# Patient Record
Sex: Female | Born: 2005 | Race: Black or African American | Hispanic: No | Marital: Single | Smoking: Never smoker
Health system: Southern US, Community
[De-identification: ages and names within clinical notes are randomized; demographics above are authoritative.]

## PROBLEM LIST (undated history)

## (undated) DIAGNOSIS — J45909 Unspecified asthma, uncomplicated: Secondary | ICD-10-CM

## (undated) DIAGNOSIS — R519 Headache, unspecified: Secondary | ICD-10-CM

## (undated) HISTORY — DX: Headache, unspecified: R51.9

## (undated) HISTORY — PX: TONSILLECTOMY: SUR1361

---
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
## 2023-01-21 ENCOUNTER — Telehealth: Payer: BC Managed Care – PPO | Admitting: Nurse Practitioner

## 2023-01-21 DIAGNOSIS — R110 Nausea: Secondary | ICD-10-CM | POA: Diagnosis not present

## 2023-01-21 DIAGNOSIS — G4489 Other headache syndrome: Secondary | ICD-10-CM

## 2023-01-21 MED ORDER — ONDANSETRON 4 MG PO TBDP: 4.0000 mg | ORAL_TABLET | Freq: Three times a day (TID) | ORAL | 0 refills | Status: AC | PRN

## 2023-01-21 NOTE — Progress Notes (Signed)
E-Visit for Nausea   We are sorry that you are not feeling well. Here is how we plan to help!  Migraines and headaches are not something that we are currently treating over E-visits. We would encourage you to talk to your primary care provider about headache management.  In order to help with your nausea and assure you are hydrated we have prescribed something for the nausea below.   I have prescribed a medication that will help alleviate your symptoms and allow you to stay hydrated:  Zofran 4 mg 1 tablet every 8 hours as needed for nausea and vomiting  HOME CARE: Drink clear liquids.  This is very important! Dehydration (the lack of fluid) can lead to a serious complication.  Start off with 1 tablespoon every 5 minutes for 8 hours. You may begin eating bland foods after 8 hours without vomiting.  Start with saltine crackers, white bread, rice, mashed potatoes, applesauce. After 48 hours on a bland diet, you may resume a normal diet. Try to go to sleep.  Sleep often empties the stomach and relieves the need to vomit.  GET HELP RIGHT AWAY IF:  Your symptoms do not improve or worsen within 2 days after treatment. You have a fever for over 3 days. You cannot keep down fluids after trying the medication.  MAKE SURE YOU:  Understand these instructions. Will watch your condition. Will get help right away if you are not doing well or get worse.    Thank you for choosing an e-visit.  Your e-visit answers were reviewed by a board certified advanced clinical practitioner to complete your personal care plan. Depending upon the condition, your plan could have included both over the counter or prescription medications.  Please review your pharmacy choice. Make sure the pharmacy is open so you can pick up prescription now. If there is a problem, you may contact your provider through [employer] and have the prescription routed to another pharmacy.  Your safety is important to [place]. If you  have drug allergies check your prescription carefully.   For the next 24 hours you can use MyChart to ask questions about today's visit, request a non-urgent call back, or ask for a work or school excuse. You will get an email in the next two days asking about your experience. I hope that your e-visit has been valuable and will speed your recovery.   Meds ordered this encounter  Medications   ondansetron (ZOFRAN-ODT) 4 MG disintegrating tablet    Sig: Take 1 tablet (4 mg total) by mouth every 8 (eight) hours as needed.    Dispense:  20 tablet    Refill:  0     I spent approximately 5 minutes reviewing the patient's history, current symptoms and coordinating their care today.

## 2023-01-22 ENCOUNTER — Encounter: Payer: Self-pay | Admitting: Family Medicine

## 2023-01-22 ENCOUNTER — Telehealth: Payer: BC Managed Care – PPO | Admitting: Family Medicine

## 2023-01-22 DIAGNOSIS — G43829 Menstrual migraine, not intractable, without status migrainosus: Secondary | ICD-10-CM

## 2023-01-22 NOTE — Progress Notes (Signed)
Would like BC changed to adjusted due to migraine like headaches Advised to reach out to PCP office for this.  Patient acknowledged agreement and understanding of the plan.

## 2023-01-23 ENCOUNTER — Encounter: Payer: Self-pay | Admitting: Family Medicine

## 2023-01-23 ENCOUNTER — Telehealth (INDEPENDENT_AMBULATORY_CARE_PROVIDER_SITE_OTHER): Payer: BC Managed Care – PPO | Admitting: Family Medicine

## 2023-01-23 DIAGNOSIS — R110 Nausea: Secondary | ICD-10-CM

## 2023-01-23 DIAGNOSIS — N946 Dysmenorrhea, unspecified: Secondary | ICD-10-CM

## 2023-01-23 DIAGNOSIS — G4489 Other headache syndrome: Secondary | ICD-10-CM | POA: Diagnosis not present

## 2023-01-23 MED ORDER — NURTEC 75 MG PO TBDP: ORAL_TABLET | ORAL | 1 refills | Status: DC

## 2023-01-23 NOTE — Progress Notes (Signed)
9  Subjective:    Patient ID: Leah Guzman, female    DOB: [birth year], 17 y.o.   MRN: [id number]  HPI Patient states that her migraines seem to start with her menstrual cycle and she would like to know if there is something that she could take to shorten the duration of her migraines during this time. (See PQ9)  Ms. trey, bebee are scheduled for a virtual visit with your provider today.    Just as we do with appointments in the office, we must obtain your consent to participate.  Your consent will be active for this visit and any virtual visit you may have with one of our providers in the next 365 days.    If you have a MyChart account, I can also send a copy of this consent to you electronically.  All virtual visits are billed to your insurance company just like a traditional visit in the office.  As this is a virtual visit, video technology does not allow for your provider to perform a traditional examination.  This may limit your provider's ability to fully assess your condition.  If your provider identifies any concerns that need to be evaluated in person or the need to arrange testing such as labs, EKG, etc, we will make arrangements to do so.    Although advances in technology are sophisticated, we cannot ensure that it will always work on either your end or our end.  If the connection with a video visit is poor, we may have to switch to a telephone visit.  With either a video or telephone visit, we are not always able to ensure that we have a secure connection.   I need to obtain your verbal consent now.   Are you willing to proceed with your visit today?  Virtual Visit via Video Note  I connected with Leah Guzman on 01/23/23 at 11:00 AM EDT by a video enabled telemedicine application and verified that I am speaking with the correct person using two identifiers.  Location: Patient: At her apartment [facility] Provider: Office   I discussed the limitations of evaluation and  management by telemedicine and the availability of in person appointments. The patient expressed understanding and agreed to proceed.  History of Present Illness:    Observations/Objective:   Assessment and Plan:   Follow Up Instructions:    I discussed the assessment and treatment plan with the patient. The patient was provided an opportunity to ask questions and all were answered. The patient agreed with the plan and demonstrated an understanding of the instructions.   The patient was advised to call back or seek an in-person evaluation if the symptoms worsen or if the condition fails to improve as anticipated.  I provided 15 minutes of non-face-to-face time during this encounter.   Lilyan Punt, MD  01/23/2023  11:05 AM She describes frequent headaches she states this is causing her to have trouble is mainly around the time of her cycles the hits on the right side of her head some nausea with occasional vomiting she is on birth control pills she wonders if that could be triggering it she states she is no longer in a relationship she is not sexually active stress levels seem to be doing okay but at times feels somewhat depressed about her ended relationship which she states it was for the past and then the future things will go well she denies being suicidal  She states she is not pregnant  Review of Systems  Objective:   Physical Exam Patient had virtual visit-video Appears to be in no distress Atraumatic Neuro able to relate and oriented No apparent resp distress Color normal      Assessment & Plan:   1. Other headache syndrome Given her frequent headaches we will try Nurtec if that does not do well enough then consider for consultation with neurology do not feel the patient needs to have a scan.  If she gets worse to follow-up  2. Nausea Zofran as needed  3. Dysmenorrhea She would like to stop her birth control pills she will go ahead and do that she will  stay off of them for least the next couple months then she will give feedback on how she is doing and she will follow-up with Sherie Don, FNP in the near future for discussion of different birth control measures.

## 2023-01-23 NOTE — Telephone Encounter (Signed)
Nurses-please write her a work excuse for yesterday and today and school excuse please send to the patient

## 2023-01-25 ENCOUNTER — Encounter: Payer: Self-pay | Admitting: Family Medicine

## 2023-01-28 ENCOUNTER — Telehealth: Payer: Self-pay | Admitting: *Deleted

## 2023-01-28 ENCOUNTER — Encounter: Payer: Self-pay | Admitting: Family Medicine

## 2023-01-28 NOTE — Telephone Encounter (Signed)
Patient notified of provider's recommendations via my chart

## 2023-01-28 NOTE — Telephone Encounter (Signed)
Nurses- Her insurance will not cover this medication-often with these type of medications sometimes it requires specialist to be able to initiate in order to get insurance to cover it If she is interested there are options Option #1 she can try sumatriptan hand again for her headaches-this is Imitrex Option #2 we can put in a referral to neurology for further evaluation- Please see what she would like to do thank you

## 2023-01-28 NOTE — Telephone Encounter (Signed)
PA for Nurtec denied. Patient must try and fail amitriptyline(or equivalent), a beta blocker and Effexor before consideration

## 2023-01-29 ENCOUNTER — Other Ambulatory Visit: Payer: Self-pay

## 2023-01-29 DIAGNOSIS — G4489 Other headache syndrome: Secondary | ICD-10-CM

## 2023-02-13 NOTE — Progress Notes (Signed)
This encounter was created in error - please disregard.

## 2023-02-21 ENCOUNTER — Encounter: Payer: Self-pay | Admitting: Neurology

## 2023-02-21 ENCOUNTER — Ambulatory Visit: Payer: BC Managed Care – PPO | Admitting: Neurology

## 2023-02-21 VITALS — BP 129/79 | HR 87 | Ht 65.0 in | Wt 142.5 lb

## 2023-02-21 DIAGNOSIS — G43829 Menstrual migraine, not intractable, without status migrainosus: Secondary | ICD-10-CM | POA: Diagnosis not present

## 2023-02-21 DIAGNOSIS — G43009 Migraine without aura, not intractable, without status migrainosus: Secondary | ICD-10-CM | POA: Diagnosis not present

## 2023-02-21 MED ORDER — SUMATRIPTAN SUCCINATE 50 MG PO TABS: 50.0000 mg | ORAL_TABLET | ORAL | 6 refills | Status: DC | PRN

## 2023-02-21 NOTE — Patient Instructions (Signed)
Sumatriptan as needed for headaches Use magnesium 400 mg nightly during your menstrual period Please contact us if headaches are getting worse Follow-up in 6 months or sooner if worse.

## 2023-02-21 NOTE — Progress Notes (Signed)
[facility]  PATIENT: Leah Guzman DOB: January 25, 2006  REQUESTING CLINICIAN: Babs Sciara, MD HISTORY FROM: Patient  REASON FOR VISIT: Management of Migraines    HISTORICAL  CHIEF COMPLAINT:  Chief Complaint  Patient presents with   New Patient (Initial Visit)    Rm12, alone BM:WUXL about 5 per month, worse upon menstruation.pcp prescribed nurtec but pt stated insurance didn't cover so never started. Pcp advised pt to dc norgestim-ethinyl estradiol triphasic tab and she did. Pt stated stopping birth control hasn't helped.  Pt stated gets fatigue and nausea upon migraines. Triggers:light, noise, fatigue, sound     HISTORY OF PRESENT ILLNESS:  This 17 year old woman past medical history of migraine headaches, anxiety who is presenting for management of her headaches.  Patient reports her headache started since middle school.  At that time she was prescribed medication but does not remember the name.  She reports for the past 3 months her migraine frequency got worse.  On average she will have 2 to 3 days of headache in the month but during her menstrual cycle she will have headaches lasting for 2 to 3 days.  Currently she is not on any preventive or abortive medication.  With her migraine she does have sensitivity to light and noise and nausea, no vomiting.  She denies any family history of migraines, denies any neurological deficit with her migraines. Her PCP started her on Nurtec but medication was not approved by insurance.    Headache History and Characteristics: Onset: Middle school  Location: Right side of head Quality:  Stabbing pain Intensity: 9/10.  Duration: 2 hrs to 3 days  Migrainous Features: Photophobia, phonophobia, nausea  Aura: No  History of brain injury or tumor: No  Family history: Denies  Motion sickness: no Cardiac history: no  OTC: tylenol, Ibuprofen  Sleep: Good   Prior prophylaxis: Propranolol: No  Verapamil:No TCA:  No Topamax: No Depakote: No Effexor: No Cymbalta: No Neurontin:No  Prior abortives: Triptan: No Anti-emetic: No Steroids: No Ergotamine suppository: No    OTHER MEDICAL CONDITIONS: Anxiety, Migraines headaches    REVIEW OF SYSTEMS: Full 14 system review of systems performed and negative with exception of: As noted in the HP   ALLERGIES: No Known Allergies  HOME MEDICATIONS: Outpatient Medications Prior to Visit  Medication Sig Dispense Refill   albuterol (VENTOLIN HFA) 108 (90 Base) MCG/ACT inhaler Inhale 2 puffs into the lungs every 6 (six) hours as needed for wheezing or shortness of breath. 8 g 0   benzonatate (TESSALON) 100 MG capsule Take 1 capsule (100 mg total) by mouth 3 (three) times daily as needed. 30 capsule 0   cetirizine (ZYRTEC) 10 MG tablet Take 1 tablet (10 mg total) by mouth daily. 30 tablet 5   citalopram (CELEXA) 10 MG tablet Take 10 mg by mouth at bedtime.     fluticasone (FLONASE) 50 MCG/ACT nasal spray Place 2 sprays into both nostrils daily. 16 g 6   hydrOXYzine (ATARAX) 25 MG tablet TAKE 1 TABLET(25 MG) BY MOUTH EVERY 8 HOURS AS NEEDED FOR ANXIETY (Patient taking differently: Take 25 mg by mouth at bedtime as needed (sleep).) 30 tablet 0   ondansetron (ZOFRAN-ODT) 4 MG disintegrating tablet Take 1 tablet (4 mg total) by mouth every 8 (eight) hours as needed. 20 tablet 0   Norgestimate-Ethinyl Estradiol Triphasic (ORTHO TRI-CYCLEN LO) 0.18/0.215/0.25 MG-25 MCG tab Take 1 tablet by mouth daily. 84 tablet 3   Rimegepant Sulfate (NURTEC) 75 MG TBDP 1 daily as needed  headache 14 tablet 1   No facility-administered medications prior to visit.    PAST MEDICAL HISTORY: Past Medical History:  Diagnosis Date   Asthma    Headache     PAST SURGICAL HISTORY: Past Surgical History:  Procedure Laterality Date   TONSILLECTOMY      FAMILY HISTORY: Family History  Problem Relation Age of Onset   Anxiety disorder Mother    Depression Mother     SOCIAL  HISTORY: Social History   Socioeconomic History   Marital status: Single    Spouse name: Not on file   Number of children: 0   Years of education: Not on file   Highest education level: Some college, no degree  Occupational History   Not on file  Tobacco Use   Smoking status: Never   Smokeless tobacco: Never  Vaping Use   Vaping status: Never Used  Substance and Sexual Activity   Alcohol use: No   Drug use: No   Sexual activity: Not Currently  Other Topics Concern   Not on file  Social History Narrative   Not on file   Social Determinants of Health   Financial Resource Strain: Not on file  Food Insecurity: Not on file  Transportation Needs: Not on file  Physical Activity: Not on file  Stress: Not on file  Social Connections: Not on file  Intimate Partner Violence: Not on file    PHYSICAL EXAM  GENERAL EXAM/CONSTITUTIONAL: Vitals:  Vitals:   02/21/23 0758  BP: 129/79  Pulse: 87  Weight: 142 lb 8 oz (64.6 kg)  Height: 5\' 5"  (1.651 m)   Body mass index is 23.71 kg/m. Wt Readings from Last 3 Encounters:  02/21/23 142 lb 8 oz (64.6 kg) (75%, Z= 0.67)*  07/13/22 128 lb (58.1 kg) (56%, Z= 0.16)*  07/01/22 130 lb (59 kg) (60%, Z= 0.25)*   * Growth percentiles are based on CDC (Girls, 2-20 Years) data.   Patient is in no distress; well developed, nourished and groomed; neck is supple  MUSCULOSKELETAL: Gait, strength, tone, movements noted in Neurologic exam below  NEUROLOGIC: MENTAL STATUS:      No data to display         awake, alert, oriented to person, place and time recent and remote memory intact normal attention and concentration language fluent, comprehension intact, naming intact fund of knowledge appropriate  CRANIAL NERVE:  2nd - no papilledema or hemorrhages on fundoscopic exam 2nd, 3rd, 4th, 6th - pupils equal and reactive to light, visual fields full to confrontation, extraocular muscles intact, no nystagmus 5th - facial sensation  symmetric 7th - facial strength symmetric 8th - hearing intact 9th - palate elevates symmetrically, uvula midline 11th - shoulder shrug symmetric 12th - tongue protrusion midline  MOTOR:  normal bulk and tone, full strength in the BUE, BLE  SENSORY:  normal and symmetric to light touch, pinprick  COORDINATION:  finger-nose-finger, fine finger movements normal  GAIT/STATION:  normal   DIAGNOSTIC DATA (LABS, IMAGING, TESTING) - I reviewed patient records, labs, notes, testing and imaging myself where available.  Lab Results  Component Value Date   WBC 10.0 07/01/2022   HGB 11.9 (L) 07/01/2022   HCT 37.3 07/01/2022   MCV 90.3 07/01/2022   PLT 325 07/01/2022      Component Value Date/Time   NA 134 (L) 07/01/2022 1226   K 3.7 07/01/2022 1226   CL 103 07/01/2022 1226   CO2 21 (L) 07/01/2022 1226   GLUCOSE 107 (H) 07/01/2022  1226   BUN 17 07/01/2022 1226   CREATININE 0.70 07/01/2022 1226   CALCIUM 9.3 07/01/2022 1226   PROT 8.7 (H) 07/01/2022 1226   ALBUMIN 4.3 07/01/2022 1226   AST 25 07/01/2022 1226   ALT 17 07/01/2022 1226   ALKPHOS 71 07/01/2022 1226   BILITOT 0.6 07/01/2022 1226   GFRNONAA >60 07/01/2022 1226   No results found for: "CHOL", "HDL", "LDLCALC", "LDLDIRECT", "TRIG", "CHOLHDL" No results found for: "HGBA1C" No results found for: "VITAMINB12" No results found for: "TSH"   ASSESSMENT AND PLAN  17 y.o. year old female with history of migraine headaches, anxiety who is presenting for management of her headaches.  Currently she is having episodic migraine but worse during her menstrual menstrual cycle.  Her migraines are not associated with any focal neurological deficit.  Plan is to start patient on sumatriptan as needed for headaches, and I have recommended her to start magnesium 400 mg nightly during her menstrual cycle.  Advised her to contact me if her headache frequency got worse, otherwise I will see her in 6 months for follow-up or sooner if  worse.   1. Migraine without aura and without status migrainosus, not intractable   2. Menstrual migraine without status migrainosus, not intractable     Patient Instructions  Sumatriptan as needed for headaches Use magnesium 400 mg nightly during your menstrual period Please contact us if headaches are getting worse Follow-up in 6 months or sooner if worse.   No orders of the defined types were placed in this encounter.   Meds ordered this encounter  Medications   SUMAtriptan (IMITREX) 50 MG tablet    Sig: Take 1 tablet (50 mg total) by mouth every 2 (two) hours as needed. May repeat in 2 hours if headache persists or recurs.    Dispense:  10 tablet    Refill:  6    Return in about 6 months (around 08/24/2023).    Windell Norfolk, MD 02/21/2023, 8:20 AM  [facility] [address], [address] [place], [place] [address] [phone]

## 2023-03-14 DIAGNOSIS — F419 Anxiety disorder, unspecified: Secondary | ICD-10-CM | POA: Diagnosis not present

## 2023-03-21 ENCOUNTER — Encounter: Payer: Self-pay | Admitting: Neurology

## 2023-03-21 ENCOUNTER — Other Ambulatory Visit (HOSPITAL_COMMUNITY): Payer: Self-pay

## 2023-03-21 ENCOUNTER — Telehealth: Payer: Self-pay

## 2023-03-21 NOTE — Telephone Encounter (Signed)
   I was asked to se if this RX needed a PA-upon a test claim it was successful-no PA needed.

## 2023-04-02 ENCOUNTER — Encounter: Payer: Self-pay | Admitting: Neurology

## 2023-04-02 NOTE — Telephone Encounter (Signed)
Yes, we can do. Please also ask her if Magnesium is helpful, if not, then we can try something stronger.

## 2023-04-22 ENCOUNTER — Ambulatory Visit: Payer: Self-pay | Admitting: Neurology

## 2023-04-29 DIAGNOSIS — H53149 Visual discomfort, unspecified: Secondary | ICD-10-CM | POA: Diagnosis not present

## 2023-04-29 DIAGNOSIS — G43809 Other migraine, not intractable, without status migrainosus: Secondary | ICD-10-CM | POA: Diagnosis not present

## 2023-04-29 DIAGNOSIS — M791 Myalgia, unspecified site: Secondary | ICD-10-CM | POA: Diagnosis not present

## 2023-04-29 DIAGNOSIS — R110 Nausea: Secondary | ICD-10-CM | POA: Diagnosis not present

## 2023-05-16 DIAGNOSIS — G935 Compression of brain: Secondary | ICD-10-CM | POA: Diagnosis not present

## 2023-05-26 ENCOUNTER — Encounter: Payer: Self-pay | Admitting: Neurology

## 2023-05-27 ENCOUNTER — Other Ambulatory Visit: Payer: Self-pay | Admitting: Neurology

## 2023-05-27 ENCOUNTER — Other Ambulatory Visit: Payer: Self-pay

## 2023-05-27 MED ORDER — TOPIRAMATE 50 MG PO TABS: 50.0000 mg | ORAL_TABLET | Freq: Every evening | ORAL | 3 refills | Status: DC

## 2023-05-27 NOTE — Telephone Encounter (Signed)
Please put the patient on the cancellation list and ask them to bring a copy of their MRI (CD) to the visit. Please advised them that I will start her on Topiramate 50 mg nightly. 1/2 tablet nightly for one week then take the full tablet.

## 2023-05-30 ENCOUNTER — Encounter: Payer: Self-pay | Admitting: Neurology

## 2023-05-30 ENCOUNTER — Ambulatory Visit: Payer: Self-pay | Admitting: Neurology

## 2023-05-30 VITALS — BP 110/65 | HR 77 | Ht 65.0 in | Wt 144.0 lb

## 2023-05-30 DIAGNOSIS — G43009 Migraine without aura, not intractable, without status migrainosus: Secondary | ICD-10-CM

## 2023-05-30 MED ORDER — NURTEC 75 MG PO TBDP: 75.0000 mg | ORAL_TABLET | ORAL | 11 refills | Status: DC | PRN

## 2023-05-30 MED ORDER — METHYLPREDNISOLONE 4 MG PO TBPK: ORAL_TABLET | ORAL | 0 refills | Status: AC

## 2023-05-30 NOTE — Progress Notes (Signed)
[facility]  PATIENT: Leah Guzman DOB: [birth year]  REQUESTING CLINICIAN: Babs Sciara, MD HISTORY FROM: Patient  REASON FOR VISIT: Management of Migraines    HISTORICAL  CHIEF COMPLAINT:  Chief Complaint  Patient presents with   Migraine    RM 11 with mother  Pt is well, reports her migraines have not improved since last visit. She is has about 4 migraines a week.    INTERVAL HISTORY 05/30/2023 Patient presents today for follow-up, she is accompanied by her mother.  Last visit was in August, at that time we prescribed her sumatriptan to use as needed and magnesium oxide to use during her menstrual cycle to help with the headaches.  She tells me today that her headaches frequency has worsened, currently she is having 4-5 headache days per week.  Sumatriptan is no longer helpful.  She did follow-up with her PCP who ordered rizatriptan and also MRI brain.  Again she tells me that rizatriptan is not as helpful.  MRI brain showed Chiari I malformation.  We started her on topiramate as preventive medication and patient has started for the past 2 days.   HISTORY OF PRESENT ILLNESS:  This 17 year old woman past medical history of migraine headaches, anxiety who is presenting for management of her headaches.  Patient reports her headache started since middle school.  At that time she was prescribed medication but does not remember the name.  She reports for the past 3 months her migraine frequency got worse.  On average she will have 2 to 3 days of headache in the month but during her menstrual cycle she will have headaches lasting for 2 to 3 days.  Currently she is not on any preventive or abortive medication.  With her migraine she does have sensitivity to light and noise and nausea, no vomiting.  She denies any family history of migraines, denies any neurological deficit with her migraines. Her PCP started her on Nurtec but medication was not approved by insurance.     Headache History and Characteristics: Onset: Middle school  Location: Right side of head Quality:  Stabbing pain Intensity: 9/10.  Duration: 2 hrs to 3 days  Migrainous Features: Photophobia, phonophobia, nausea  Aura: No  History of brain injury or tumor: No  Family history: Denies  Motion sickness: no Cardiac history: no  OTC: tylenol, Ibuprofen  Sleep: Good   Prior prophylaxis: Propranolol: No Verapamil:No TCA: No Topamax: Yes Depakote: No Effexor: No Cymbalta: No Neurontin:No  Prior abortives: Triptan: Sumatriptan, Rizatriptan  Anti-emetic: No Steroids: No Ergotamine suppository: No    OTHER MEDICAL CONDITIONS: Anxiety, Migraines headaches    REVIEW OF SYSTEMS: Full 14 system review of systems performed and negative with exception of: As noted in the HP   ALLERGIES: No Known Allergies  HOME MEDICATIONS: Outpatient Medications Prior to Visit  Medication Sig Dispense Refill   albuterol (VENTOLIN HFA) 108 (90 Base) MCG/ACT inhaler Inhale 2 puffs into the lungs every 6 (six) hours as needed for wheezing or shortness of breath. 8 g 0   citalopram (CELEXA) 10 MG tablet Take 10 mg by mouth at bedtime.     fluticasone (FLONASE) 50 MCG/ACT nasal spray Place 2 sprays into both nostrils daily. 16 g 6   hydrOXYzine (ATARAX) 25 MG tablet TAKE 1 TABLET(25 MG) BY MOUTH EVERY 8 HOURS AS NEEDED FOR ANXIETY (Patient taking differently: Take 25 mg by mouth at bedtime as needed (sleep).) 30 tablet 0   ondansetron (ZOFRAN-ODT) 4 MG disintegrating tablet Take  1 tablet (4 mg total) by mouth every 8 (eight) hours as needed. 20 tablet 0   rizatriptan (MAXALT) 10 MG tablet Take 10 mg by mouth as needed for migraine. May repeat in 2 hours if needed     topiramate (TOPAMAX) 50 MG tablet Take 1 tablet (50 mg total) by mouth at bedtime. 30 tablet 3   benzonatate (TESSALON) 100 MG capsule Take 1 capsule (100 mg total) by mouth 3 (three) times daily as needed. (Patient not taking:  Reported on 05/30/2023) 30 capsule 0   cetirizine (ZYRTEC) 10 MG tablet Take 1 tablet (10 mg total) by mouth daily. (Patient not taking: Reported on 05/30/2023) 30 tablet 5   No facility-administered medications prior to visit.    PAST MEDICAL HISTORY: Past Medical History:  Diagnosis Date   Asthma    Headache     PAST SURGICAL HISTORY: Past Surgical History:  Procedure Laterality Date   TONSILLECTOMY      FAMILY HISTORY: Family History  Problem Relation Age of Onset   Anxiety disorder Mother    Depression Mother     SOCIAL HISTORY: Social History   Socioeconomic History   Marital status: Single    Spouse name: Not on file   Number of children: 0   Years of education: Not on file   Highest education level: Some college, no degree  Occupational History   Not on file  Tobacco Use   Smoking status: Never   Smokeless tobacco: Never  Vaping Use   Vaping status: Never Used  Substance and Sexual Activity   Alcohol use: No   Drug use: No   Sexual activity: Not Currently  Other Topics Concern   Not on file  Social History Narrative   Not on file   Social Determinants of Health   Financial Resource Strain: Not on file  Food Insecurity: Not on file  Transportation Needs: Not on file  Physical Activity: Not on file  Stress: Not on file  Social Connections: Not on file  Intimate Partner Violence: Not on file    PHYSICAL EXAM  GENERAL EXAM/CONSTITUTIONAL: Vitals:  Vitals:   05/30/23 1452  BP: 110/65  Pulse: 77  Weight: 144 lb (65.3 kg)  Height: 5\' 5"  (1.651 m)   Body mass index is 23.96 kg/m. Wt Readings from Last 3 Encounters:  05/30/23 144 lb (65.3 kg) (76%, Z= 0.70)*  02/21/23 142 lb 8 oz (64.6 kg) (75%, Z= 0.67)*  07/13/22 128 lb (58.1 kg) (56%, Z= 0.16)*   * Growth percentiles are based on CDC (Girls, 2-20 Years) data.   Patient is in no distress; well developed, nourished and groomed; neck is supple  MUSCULOSKELETAL: Gait, strength, tone,  movements noted in Neurologic exam below  NEUROLOGIC: MENTAL STATUS:      No data to display         awake, alert, oriented to person, place and time recent and remote memory intact normal attention and concentration language fluent, comprehension intact, naming intact fund of knowledge appropriate  CRANIAL NERVE:  2nd - no papilledema or hemorrhages on fundoscopic exam 2nd, 3rd, 4th, 6th - pupils equal and reactive to light, visual fields full to confrontation, extraocular muscles intact, no nystagmus 5th - facial sensation symmetric 7th - facial strength symmetric 8th - hearing intact 9th - palate elevates symmetrically, uvula midline 11th - shoulder shrug symmetric 12th - tongue protrusion midline  MOTOR:  normal bulk and tone, full strength in the BUE, BLE  SENSORY:  normal and  symmetric to light touch  COORDINATION:  finger-nose-finger, fine finger movements normal  GAIT/STATION:  normal   DIAGNOSTIC DATA (LABS, IMAGING, TESTING) - I reviewed patient records, labs, notes, testing and imaging myself where available.  Lab Results  Component Value Date   WBC 10.0 07/01/2022   HGB 11.9 (L) 07/01/2022   HCT 37.3 07/01/2022   MCV 90.3 07/01/2022   PLT 325 07/01/2022      Component Value Date/Time   NA 134 (L) 07/01/2022 1226   K 3.7 07/01/2022 1226   CL 103 07/01/2022 1226   CO2 21 (L) 07/01/2022 1226   GLUCOSE 107 (H) 07/01/2022 1226   BUN 17 07/01/2022 1226   CREATININE 0.70 07/01/2022 1226   CALCIUM 9.3 07/01/2022 1226   PROT 8.7 (H) 07/01/2022 1226   ALBUMIN 4.3 07/01/2022 1226   AST 25 07/01/2022 1226   ALT 17 07/01/2022 1226   ALKPHOS 71 07/01/2022 1226   BILITOT 0.6 07/01/2022 1226   GFRNONAA >60 07/01/2022 1226   No results found for: "CHOL", "HDL", "LDLCALC", "LDLDIRECT", "TRIG", "CHOLHDL" No results found for: "HGBA1C" No results found for: "VITAMINB12" No results found for: "TSH"  Recent MRI brain showed chiari I malformation and no  other abnormalities.   ASSESSMENT AND PLAN  17 y.o. year old female with history of migraine headaches, anxiety who is presenting for follow up her headaches.  She reports worsening frequency of her headaches, 4-5 headaches days per week.  Rizatriptan and sumatriptan not helpful, she just started Topiramate as preventive.  Will give patient a Medrol Dosepak to break the cycle of this headache and prescribe her Nurtec since she has failed both sumatriptan and rizatriptan.  I will see her as scheduled in February or sooner if worse.  1. Migraine without aura and without status migrainosus, not intractable      Patient Instructions  Continue with topiramate 50 mg nightly Start Medrol Dosepak for the next 6 days Start Nurtec as abortive medication for your migraines Follow-up as scheduled in February or sooner if worse.   No orders of the defined types were placed in this encounter.   Meds ordered this encounter  Medications   methylPREDNISolone (MEDROL DOSEPAK) 4 MG TBPK tablet    Sig: Take 6 tablets (24 mg total) by mouth daily for 1 day, THEN 5 tablets (20 mg total) daily for 1 day, THEN 4 tablets (16 mg total) daily for 1 day, THEN 3 tablets (12 mg total) daily for 1 day, THEN 2 tablets (8 mg total) daily for 1 day, THEN 1 tablet (4 mg total) daily for 1 day.    Dispense:  21 tablet    Refill:  0   Rimegepant Sulfate (NURTEC) 75 MG TBDP    Sig: Take 1 tablet (75 mg total) by mouth as needed.    Dispense:  8 tablet    Refill:  11    Return in about 3 months (around 09/02/2023).    Windell Norfolk, MD 05/30/2023, 5:17 PM  [facility] [address] [place], [place] [address] [phone]

## 2023-05-30 NOTE — Patient Instructions (Signed)
Continue with topiramate 50 mg nightly Start Medrol Dosepak for the next 6 days Start Nurtec as abortive medication for your migraines Follow-up as scheduled in February or sooner if worse.

## 2023-06-03 ENCOUNTER — Other Ambulatory Visit: Payer: Self-pay

## 2023-06-03 MED ORDER — NURTEC 75 MG PO TBDP: 75.0000 mg | ORAL_TABLET | ORAL | 11 refills | Status: DC | PRN

## 2023-06-05 ENCOUNTER — Telehealth: Payer: Self-pay

## 2023-06-05 NOTE — Telephone Encounter (Signed)
*  [facility]  Pharmacy Patient Advocate Encounter   Received notification from CoverMyMeds that prior authorization for Nurtec 75MG  dispersible tablets  is required/requested.   Insurance verification completed.   The patient is insured through [facility] .   Per test claim: PA required; PA submitted to above mentioned insurance via CoverMyMeds Key/confirmation #/EOC [id number] Status is pending

## 2023-06-10 ENCOUNTER — Other Ambulatory Visit: Payer: Self-pay | Admitting: Neurology

## 2023-06-10 MED ORDER — UBRELVY 50 MG PO TABS: 50.0000 mg | ORAL_TABLET | ORAL | 6 refills | Status: AC | PRN

## 2023-06-10 NOTE — Telephone Encounter (Signed)
Will try the patient on Ubrelvy

## 2023-06-10 NOTE — Telephone Encounter (Addendum)
Pharmacy Patient Advocate Encounter  Received notification from [facility] that Prior Authorization for Nurtec 75MG  dispersible tablets has been DENIED.  See denial reason below. No denial letter attached in CMM. Will attach denial letter to Media tab once received.    PA #/Case ID/Reference #: PA Case ID #: [id number]  I have not received denial letter yet-or reason for denial, however while looking back at the questions it seems it was denied due to PT not having tried [place] first.  There is also an option to appeal via CMM- Key: [id number]

## 2023-06-10 NOTE — Progress Notes (Signed)
Nurtec not approved by patient insurance, will try [place]

## 2023-06-12 ENCOUNTER — Other Ambulatory Visit (HOSPITAL_COMMUNITY): Payer: Self-pay

## 2023-06-12 ENCOUNTER — Telehealth: Payer: Self-pay

## 2023-06-12 NOTE — Telephone Encounter (Signed)
Pharmacy Patient Advocate Encounter   Received notification from CoverMyMeds that prior authorization for Ubrelvy 50MG  tablets is required/requested.   Insurance verification completed.   The patient is insured through [facility] .   Per test claim: PA required; PA submitted to above mentioned insurance via CoverMyMeds Key/confirmation #/EOC [id number] Status is pending

## 2023-06-13 ENCOUNTER — Other Ambulatory Visit (HOSPITAL_COMMUNITY): Payer: Self-pay

## 2023-06-13 NOTE — Telephone Encounter (Signed)
Pharmacy Patient Advocate Encounter  Received notification from [facility] that Prior Authorization for Ubrelvy 50mg  has been APPROVED from 06/12/23 to 09/04/23. Ran test claim, Copay is $0. This test claim was processed through [facility]- copay amounts may vary at other pharmacies due to pharmacy/plan contracts, or as the patient moves through the different stages of their insurance plan.

## 2023-06-13 NOTE — Telephone Encounter (Signed)
PA Case ID #: [id number]

## 2023-07-19 DIAGNOSIS — G43009 Migraine without aura, not intractable, without status migrainosus: Secondary | ICD-10-CM | POA: Diagnosis not present

## 2023-09-02 ENCOUNTER — Ambulatory Visit: Payer: BC Managed Care – PPO | Admitting: Neurology

## 2023-09-05 ENCOUNTER — Encounter: Payer: Self-pay | Admitting: Nurse Practitioner

## 2023-09-05 ENCOUNTER — Other Ambulatory Visit: Payer: Self-pay | Admitting: Nurse Practitioner

## 2023-09-07 ENCOUNTER — Other Ambulatory Visit: Payer: Self-pay | Admitting: Nurse Practitioner

## 2023-09-07 MED ORDER — NORGESTIM-ETH ESTRAD TRIPHASIC 0.18/0.215/0.25 MG-25 MCG PO TABS: 1.0000 | ORAL_TABLET | Freq: Every day | ORAL | 0 refills | Status: DC

## 2023-09-16 DIAGNOSIS — Z1330 Encounter for screening examination for mental health and behavioral disorders, unspecified: Secondary | ICD-10-CM | POA: Diagnosis not present

## 2023-09-16 DIAGNOSIS — F419 Anxiety disorder, unspecified: Secondary | ICD-10-CM | POA: Diagnosis not present

## 2023-09-26 ENCOUNTER — Other Ambulatory Visit: Payer: Self-pay | Admitting: Neurology

## 2023-10-08 DIAGNOSIS — F419 Anxiety disorder, unspecified: Secondary | ICD-10-CM | POA: Diagnosis not present

## 2023-11-05 DIAGNOSIS — F419 Anxiety disorder, unspecified: Secondary | ICD-10-CM | POA: Diagnosis not present

## 2023-11-12 DIAGNOSIS — F419 Anxiety disorder, unspecified: Secondary | ICD-10-CM | POA: Diagnosis not present

## 2023-11-28 DIAGNOSIS — F419 Anxiety disorder, unspecified: Secondary | ICD-10-CM | POA: Diagnosis not present

## 2023-11-29 DIAGNOSIS — G939 Disorder of brain, unspecified: Secondary | ICD-10-CM | POA: Diagnosis not present

## 2023-11-29 DIAGNOSIS — G43009 Migraine without aura, not intractable, without status migrainosus: Secondary | ICD-10-CM | POA: Diagnosis not present

## 2023-11-29 DIAGNOSIS — T50905A Adverse effect of unspecified drugs, medicaments and biological substances, initial encounter: Secondary | ICD-10-CM | POA: Diagnosis not present

## 2023-12-17 DIAGNOSIS — F419 Anxiety disorder, unspecified: Secondary | ICD-10-CM | POA: Diagnosis not present

## 2024-01-14 DIAGNOSIS — F419 Anxiety disorder, unspecified: Secondary | ICD-10-CM | POA: Diagnosis not present

## 2024-01-29 DIAGNOSIS — F419 Anxiety disorder, unspecified: Secondary | ICD-10-CM | POA: Diagnosis not present

## 2024-01-31 DIAGNOSIS — G43009 Migraine without aura, not intractable, without status migrainosus: Secondary | ICD-10-CM | POA: Diagnosis not present

## 2024-01-31 DIAGNOSIS — G939 Disorder of brain, unspecified: Secondary | ICD-10-CM | POA: Diagnosis not present

## 2024-03-26 DIAGNOSIS — F419 Anxiety disorder, unspecified: Secondary | ICD-10-CM | POA: Diagnosis not present

## 2024-04-16 DIAGNOSIS — F419 Anxiety disorder, unspecified: Secondary | ICD-10-CM | POA: Diagnosis not present

## 2024-05-07 DIAGNOSIS — F419 Anxiety disorder, unspecified: Secondary | ICD-10-CM | POA: Diagnosis not present

## 2024-05-08 DIAGNOSIS — G43009 Migraine without aura, not intractable, without status migrainosus: Secondary | ICD-10-CM | POA: Diagnosis not present

## 2024-05-21 DIAGNOSIS — F419 Anxiety disorder, unspecified: Secondary | ICD-10-CM | POA: Diagnosis not present

## 2024-05-27 DIAGNOSIS — F419 Anxiety disorder, unspecified: Secondary | ICD-10-CM | POA: Diagnosis not present

## 2024-06-17 DIAGNOSIS — F419 Anxiety disorder, unspecified: Secondary | ICD-10-CM | POA: Diagnosis not present

## 2024-06-29 ENCOUNTER — Ambulatory Visit: Admitting: Physician Assistant

## 2024-06-29 ENCOUNTER — Encounter: Payer: Self-pay | Admitting: Physician Assistant

## 2024-06-29 VITALS — BP 148/94 | HR 101 | Temp 98.2°F | Ht 65.0 in | Wt 140.0 lb

## 2024-06-29 DIAGNOSIS — H6992 Unspecified Eustachian tube disorder, left ear: Secondary | ICD-10-CM | POA: Diagnosis not present

## 2024-06-29 DIAGNOSIS — H6122 Impacted cerumen, left ear: Secondary | ICD-10-CM | POA: Diagnosis not present

## 2024-06-29 MED ORDER — FLUTICASONE PROPIONATE 50 MCG/ACT NA SUSP: 2.0000 | Freq: Every day | NASAL | 6 refills | Status: AC

## 2024-06-29 NOTE — Progress Notes (Signed)
" ° °  Acute Office Visit  Subjective:     Patient ID: Leah Guzman, female    DOB: May 31, 2006, 18 y.o.   MRN: [id number]   Discussed the use of AI scribe software for clinical note transcription with the patient, who gave verbal consent to proceed.  History of Present Illness Leah Guzman is a 18 year old female who presents with a clogged sensation in her left ear. She has had a worsening clogged sensation in her left ear for 2 to 3 days with decreased hearing in that ear. She denies ear pain, itchiness, congestion, cough, headache, or fever. She has not had similar symptoms before and typically has a lot of earwax. She has frequent migraines but no migraine symptoms related to this ear issue.   Review of Systems  Constitutional:  Negative for activity change, fatigue and fever.  HENT:  Positive for ear pain (fullness) and hearing loss (muffled). Negative for congestion, postnasal drip, rhinorrhea, sneezing and sore throat.   Respiratory:  Negative for cough.          Objective:     BP (!) 148/94 (BP Location: Left Arm, Patient Position: Sitting)   Pulse (!) 101   Temp 98.2 F (36.8 C)   Ht 5' 5 (1.651 m)   Wt 140 lb (63.5 kg)   SpO2 100%   BMI 23.30 kg/m   Physical Exam Vitals reviewed.  Constitutional:      General: She is not in acute distress.    Appearance: Normal appearance. She is not ill-appearing.  HENT:     Head: Normocephalic and atraumatic.     Right Ear: Tympanic membrane normal.     Left Ear: Tympanic membrane normal. There is impacted cerumen.     Nose: Nose normal.     Mouth/Throat:     Mouth: Mucous membranes are moist.     Pharynx: Oropharynx is clear. No posterior oropharyngeal erythema.  Eyes:     Extraocular Movements: Extraocular movements intact.     Conjunctiva/sclera: Conjunctivae normal.  Cardiovascular:     Rate and Rhythm: Normal rate and regular rhythm.     Heart sounds: Normal heart sounds. No murmur heard. Pulmonary:     Effort:  Pulmonary effort is normal.     Breath sounds: Normal breath sounds. No wheezing, rhonchi or rales.  Skin:    General: Skin is warm and dry.  Neurological:     General: No focal deficit present.     Mental Status: She is alert and oriented to person, place, and time.  Psychiatric:        Mood and Affect: Mood normal.        Behavior: Behavior normal.     No results found for any visits on 06/29/24.      Assessment & Plan:  Impacted cerumen of left ear Assessment & Plan: Left ear impacted cerumen causing clogging sensation and hearing difficulty. No pain, itching, fever, or systemic symptoms. Significant wax obstructing eardrum view. - Flushed left ear to remove impacted cerumen. - Re-evaluated ear canal and eardrum post-flushing for clearance.TM is non erythematous and non bulging. Symptoms much improved after flush.  - Advised flonase  as needed for Eustachian tube dysfunction.   Orders: -     Ear Lavage  Dysfunction of left eustachian tube -     Fluticasone  Propionate; Place 2 sprays into both nostrils daily.  Dispense: 16 g; Refill: 6   No follow-ups on file.  Irena Gaydos, PA-C  "

## 2024-06-29 NOTE — Assessment & Plan Note (Signed)
 Left ear impacted cerumen causing clogging sensation and hearing difficulty. No pain, itching, fever, or systemic symptoms. Significant wax obstructing eardrum view. - Flushed left ear to remove impacted cerumen. - Re-evaluated ear canal and eardrum post-flushing for clearance.TM is non erythematous and non bulging. Symptoms much improved after flush.  - Advised flonase  as needed for Eustachian tube dysfunction.
